# Patient Record
Sex: Female | Born: 1965 | Race: White | Hispanic: No | Marital: Married
Health system: Midwestern US, Community
[De-identification: ages and names within clinical notes are randomized; demographics above are authoritative.]

---

## 2003-10-15 NOTE — Op Note (Signed)
[facility]                                OPERATION REPORT                        SURGEON:  Katrinka Blazing, M.D.   Arville Lime Cheryl Calderon, Cheryl Calderon   E:   MR  [birth year]                         DATE:            10/15/2003   #:   Cheryl Calderon  [id number]                      PT. LOCATION:    OR  OR18   #   Katrinka Blazing, M.D.   cc:    Katrinka Blazing, M.D.   PREOPERATIVE DIAGNOSIS:   Endometrial polyp.  Dysfunctional uterine bleeding.   POSTOPERATIVE DIAGNOSIS:   Same.   OPERATION PERFORMED:   1. Hysteroscopy.   2. Fractional D&amp;C.   SURGEON:   Viviann Spare B. Powers, M.D.   ANESTHESIA:General endotracheal anesthesia by [facility]   COMPLICATIONS:   None   ESTIMATED BLOOD LOSS:   Minimal   DESCRIPTION OF PROCEDURE:  The patient was brought to the operating room   and given excellent general endotracheal anesthesia, prepped and draped in   the usual manner for this type of surgical procedure.  Bimanual examination   revealed normal sized anteverted mobile uterus, somewhat soft, but   consistent with some adenomyosis.  No adnexal masses or uterosacral   nodularity.  Was noted to have stage 2 uterine prolapse.   A weighted   speculum was placed posterior, Sims speculum placed anterior, cervix   grasped with a single toothed tenaculum and then the hysteroscope was   introduced and after we dilated to a #12 Hegar dilator endometrial polyp   was identified and removed with polyp forceps.  Then an ECC was performed   after we had withdrawn the hysteroscope, and then dilated with a #20 Hagar   dilator, then passed off the field.  We then performed endometrial   curettings with a moderate amount of endometrial-like tissue.  This tissue   was passed off the field.  Tenaculum removed from the cervix.  Speculum was   removed from the vagina.  There were no intraoperative complications. All   needle, sponge and instrument counts are correct at the end of the surgical   procedure.  The patient was sent to  the recovery room in excellent   condition.  She will be discharged home on Darvocet-N 100, dispense 30, 1-2   every 4-6 hours as needed for pain.  Should she have severe abdominal pain,   nausea and vomiting, fever, chills, heavy vaginal bleeding, or any problems   whatsoever she is to notify me ASAP.   Electronically Signed By:   Katrinka Blazing, M.D. 10/16/2003 15:51   _________________________________   Katrinka Blazing, M.D.   hp  D:  10/15/2003  T:  10/15/2003  3:03 P   [id number]

## 2003-10-15 NOTE — Op Note (Signed)
[facility]                                OPERATION REPORT                        SURGEON:  Katrinka Blazing, M.D.   Arville Lime JERLINE, LINZY   E:   MR  16-10-96                         DATE:            10/15/2003   #:   Lindley Magnus  [id number]                      PT. LOCATION:    OR  OR18   #   Katrinka Blazing, M.D.   cc:    Katrinka Blazing, M.D.   PREOPERATIVE DIAGNOSIS:   Endometrial polyp.  Dysfunctional uterine bleeding.   POSTOPERATIVE DIAGNOSIS:   Same.   OPERATION PERFORMED:   1. Hysteroscopy.   2. Fractional D&amp;C.   SURGEON:   Viviann Spare B. Powers, M.D.   ANESTHESIA:General endotracheal anesthesia by [facility]   COMPLICATIONS:   None   ESTIMATED BLOOD LOSS:   Minimal   DESCRIPTION OF PROCEDURE:  The patient was brought to the operating room   and given excellent general endotracheal anesthesia, prepped and draped in   the usual manner for this type of surgical procedure.  Bimanual examination   revealed normal sized anteverted mobile uterus, somewhat soft, but   consistent with some adenomyosis.  No adnexal masses or uterosacral   nodularity.  Was noted to have stage 2 uterine prolapse.   A weighted   speculum was placed posterior, Sims speculum placed anterior, cervix   grasped with a single toothed tenaculum and then the hysteroscope was   introduced and after we dilated to a #12 Hegar dilator endometrial polyp   was identified and removed with polyp forceps.  Then an ECC was performed   after we had withdrawn the hysteroscope, and then dilated with a #20 Hagar   dilator, then passed off the field.  We then performed endometrial   curettings with a moderate amount of endometrial-like tissue.  This tissue   was passed off the field.  Tenaculum removed from the cervix.  Speculum was   removed from the vagina.  There were no intraoperative complications. All   needle, sponge and instrument counts are correct at the end of the surgical    procedure.  The patient was sent to the recovery room in excellent   condition.  She will be discharged home on Darvocet-N 100, dispense 30, 1-2   every 4-6 hours as needed for pain.  Should she have severe abdominal pain,   nausea and vomiting, fever, chills, heavy vaginal bleeding, or any problems   whatsoever she is to notify me ASAP.   Electronically Signed By:   Katrinka Blazing, M.D. 10/16/2003 15:51   _________________________________   Katrinka Blazing, M.D.   hp  D:  10/15/2003  T:  10/15/2003  3:03 P   [id number]

## 2015-09-08 ENCOUNTER — Encounter: Primary: Student in an Organized Health Care Education/Training Program

## 2015-09-08 ENCOUNTER — Inpatient Hospital Stay
Admit: 2015-09-08 | Payer: PRIVATE HEALTH INSURANCE | Primary: Student in an Organized Health Care Education/Training Program

## 2015-09-08 DIAGNOSIS — M25562 Pain in left knee: Secondary | ICD-10-CM
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
## 2016-03-10 ENCOUNTER — Inpatient Hospital Stay
Admit: 2016-03-10 | Payer: BLUE CROSS/BLUE SHIELD | Primary: Student in an Organized Health Care Education/Training Program

## 2016-03-10 ENCOUNTER — Encounter: Primary: Student in an Organized Health Care Education/Training Program

## 2016-03-10 DIAGNOSIS — M25561 Pain in right knee: Secondary | ICD-10-CM
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
## 2016-03-13 ENCOUNTER — Encounter: Primary: Student in an Organized Health Care Education/Training Program

## 2016-03-24 ENCOUNTER — Inpatient Hospital Stay
Admit: 2016-03-24 | Payer: BLUE CROSS/BLUE SHIELD | Primary: Student in an Organized Health Care Education/Training Program

## 2016-03-24 NOTE — Progress Notes (Signed)
[place] [facility] AT [address], [place], [place] [address]   Phone: [phone]  Fax: [phone]  DISCHARGE SUMMARY  Patient Name: Cheryl Calderon DOB: 1966/01/18   Treatment/Medical Diagnosis: Bilateral knee pain [M25.561, M25.562]   Referral Source: Shall, Brooks Sailors, MD     Date of Initial Visit: 03/10/16 Attended Visits: 2 Missed Visits: 0     SUMMARY OF TREATMENT  Therapeutic exercises including ROM, strengthening, stretching, manual therapy including joint and soft tissue manipulation, balance training, postural re-education, HEP instruction.  CURRENT STATUS  D/C patient after 2 follow up visits secondary to patient request to established HEP in order to manage BIL knee sx at home. Pt educated in [place] knee and hip strengthening and flexibility HEP.     Goal/Measure of Progress Goal Met?   1.  Establish home exercise program to be performed 3-4 days per week.   Status at last Eval: Established  Current Status: 1-2 days per week  Progressing   2.  Increase L knee AROM in flexion and extension by 5 degrees to facilitate improved ability to tolerate prolonged ambulation.   Status at last Eval: L knee AROM flex = 122  L knee AROM ext = -7 Current Status: L knee AROM flex = 130  L knee AROM ext = -7 Partially met   3.  Increase L knee hamstring 90/90 to 162 deg in order to improve ease with prolonged standing.    Status at last Eval: L hamstring 90/90 = 156 deg Current Status: L hamstring 90/90 = 163 yes     RECOMMENDATIONS  Discontinue therapy. Progressing towards or have reached established goals.  If you have any questions/comments please contact us directly at [phone].   Thank you for allowing [place] to assist in the care of your patient.    Therapist Signature: Jake Church Date: 03/24/2016     Time: 1:45 PM
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
